# Patient Record
Sex: Male | Born: 1955 | Race: Black or African American | Hispanic: No | Marital: Single | Smoking: Never smoker
Health system: Southern US, Community
[De-identification: ages and names within clinical notes are randomized; demographics above are authoritative.]

## PROBLEM LIST (undated history)

## (undated) DIAGNOSIS — S37009A Unspecified injury of unspecified kidney, initial encounter: Secondary | ICD-10-CM

## (undated) DIAGNOSIS — I10 Essential (primary) hypertension: Secondary | ICD-10-CM

## (undated) HISTORY — DX: Essential (primary) hypertension: I10

## (undated) HISTORY — DX: Unspecified injury of unspecified kidney, initial encounter: S37.009A

## (undated) HISTORY — PX: HAND SURGERY: SHX662

---

## 1981-07-06 DIAGNOSIS — I10 Essential (primary) hypertension: Secondary | ICD-10-CM

## 1981-07-06 HISTORY — DX: Essential (primary) hypertension: I10

## 2003-05-08 ENCOUNTER — Other Ambulatory Visit: Payer: Self-pay

## 2003-07-10 ENCOUNTER — Other Ambulatory Visit: Payer: Self-pay

## 2004-08-18 ENCOUNTER — Emergency Department: Payer: Self-pay | Admitting: Emergency Medicine

## 2010-10-17 ENCOUNTER — Ambulatory Visit: Payer: Self-pay | Admitting: Gastroenterology

## 2010-10-18 LAB — HM COLONOSCOPY

## 2010-10-21 LAB — PATHOLOGY REPORT

## 2012-08-09 ENCOUNTER — Ambulatory Visit: Payer: Self-pay | Admitting: Internal Medicine

## 2012-08-15 ENCOUNTER — Ambulatory Visit: Payer: Self-pay | Admitting: Internal Medicine

## 2012-09-07 ENCOUNTER — Ambulatory Visit: Payer: Self-pay | Admitting: Internal Medicine

## 2012-09-16 ENCOUNTER — Ambulatory Visit (INDEPENDENT_AMBULATORY_CARE_PROVIDER_SITE_OTHER): Payer: BC Managed Care – PPO | Admitting: Internal Medicine

## 2012-09-16 ENCOUNTER — Encounter: Payer: Self-pay | Admitting: Internal Medicine

## 2012-09-16 VITALS — BP 108/78 | HR 74 | Temp 98.1°F | Ht 65.5 in | Wt 148.0 lb

## 2012-09-16 DIAGNOSIS — R0609 Other forms of dyspnea: Secondary | ICD-10-CM | POA: Insufficient documentation

## 2012-09-16 DIAGNOSIS — I10 Essential (primary) hypertension: Secondary | ICD-10-CM | POA: Insufficient documentation

## 2012-09-16 NOTE — Assessment & Plan Note (Signed)
Blood pressure well-controlled on current medications. Reviewed recent renal function drawn by his employer. Creatinine 1.3. Will request previous records to see if this is stable. Followup one month.

## 2012-09-16 NOTE — Progress Notes (Signed)
Subjective:    Patient ID: George Howell, male    DOB: 02-29-1956, 57 y.o.   MRN: [id number]  HPI 57 year old male with history of hypertension presents to establish care. He reports he is generally been feeling well. He notes a single episode of shortness of breath with exertion when he was walking with his son. He denies chest pain. He denies diaphoresis or nausea. He reports that he was recently walking in the park with his son and experienced no shortness of breath. He does not smoke. He does not have a history of asthma. He reports that he is compliant with his medications. He notes that he had a stress test in the past which was normal.  Outpatient Encounter Prescriptions as of 09/16/2012  Medication Sig Dispense Refill  . atenolol (TENORMIN) 50 MG tablet Take 50 mg by mouth daily.      . Cholecalciferol (VITAMIN D3) 2000 UNITS TABS Take 2,000 Units by mouth daily.      . enalapril (VASOTEC) 5 MG tablet Take 5 mg by mouth daily.      . hydrochlorothiazide (MICROZIDE) 12.5 MG capsule Take 12.5 mg by mouth daily.       No facility-administered encounter medications on file as of 09/16/2012.   BP 108/78  Pulse 74  Temp(Src) 98.1 F (36.7 C) (Oral)  Ht 5' 5.5" (1.664 m)  Wt 148 lb (67.132 kg)  BMI 24.25 kg/m2  SpO2 95%  Review of Systems  Constitutional: Negative for fever, chills, activity change, appetite change, fatigue and unexpected weight change.  Eyes: Negative for visual disturbance.  Respiratory: Positive for shortness of breath. Negative for cough.   Cardiovascular: Negative for chest pain, palpitations and leg swelling.  Gastrointestinal: Negative for abdominal pain and abdominal distention.  Genitourinary: Negative for dysuria, urgency and difficulty urinating.  Musculoskeletal: Negative for arthralgias and gait problem.  Skin: Negative for color change and rash.  Hematological: Negative for adenopathy.  Psychiatric/Behavioral: Negative for sleep disturbance and dysphoric  mood. The patient is not nervous/anxious.        Objective:   Physical Exam  Constitutional: He is oriented to person, place, and time. He appears well-developed and well-nourished. No distress.  HENT:  Head: Normocephalic and atraumatic.  Right Ear: External ear normal.  Left Ear: External ear normal.  Nose: Nose normal.  Mouth/Throat: Oropharynx is clear and moist. No oropharyngeal exudate.  Eyes: Conjunctivae and EOM are normal. Pupils are equal, round, and reactive to light. Right eye exhibits no discharge. Left eye exhibits no discharge. No scleral icterus.  Neck: Normal range of motion. Neck supple. No tracheal deviation present. No thyromegaly present.  Cardiovascular: Normal rate, regular rhythm and normal heart sounds.  Exam reveals no gallop and no friction rub.   No murmur heard. Pulmonary/Chest: Effort normal and breath sounds normal. No respiratory distress. He has no wheezes. He has no rales. He exhibits no tenderness.  Abdominal: Soft. Bowel sounds are normal. He exhibits no distension and no mass. There is no tenderness. There is no rebound and no guarding.  Musculoskeletal: Normal range of motion. He exhibits no edema.  Lymphadenopathy:    He has no cervical adenopathy.  Neurological: He is alert and oriented to person, place, and time. No cranial nerve deficit. Coordination normal.  Skin: Skin is warm and dry. No rash noted. He is not diaphoretic. No erythema. No pallor.  Psychiatric: He has a normal mood and affect. His behavior is normal. Judgment and thought content normal.  Assessment & Plan:

## 2012-09-16 NOTE — Assessment & Plan Note (Signed)
Patient had a single episode of dyspnea on exertion. Denies chest pain. Exam is normal today. EKG today normal. Reports history of stress test in the past. We'll request records on this. Followup 4 weeks.

## 2012-10-11 ENCOUNTER — Encounter: Payer: Self-pay | Admitting: Internal Medicine

## 2012-11-10 ENCOUNTER — Ambulatory Visit: Payer: PRIVATE HEALTH INSURANCE | Admitting: Internal Medicine

## 2012-11-24 ENCOUNTER — Encounter: Payer: Self-pay | Admitting: Internal Medicine

## 2012-11-24 ENCOUNTER — Ambulatory Visit (INDEPENDENT_AMBULATORY_CARE_PROVIDER_SITE_OTHER): Payer: BC Managed Care – PPO | Admitting: Internal Medicine

## 2012-11-24 VITALS — BP 110/78 | HR 67 | Temp 98.1°F | Wt 144.0 lb

## 2012-11-24 DIAGNOSIS — R0989 Other specified symptoms and signs involving the circulatory and respiratory systems: Secondary | ICD-10-CM

## 2012-11-24 DIAGNOSIS — R0789 Other chest pain: Secondary | ICD-10-CM | POA: Insufficient documentation

## 2012-11-24 DIAGNOSIS — I10 Essential (primary) hypertension: Secondary | ICD-10-CM

## 2012-11-24 LAB — COMPREHENSIVE METABOLIC PANEL
ALT: 15 U/L (ref 0–53)
Albumin: 3.6 g/dL (ref 3.5–5.2)
CO2: 26 mEq/L (ref 19–32)
Calcium: 8.9 mg/dL (ref 8.4–10.5)
Chloride: 106 mEq/L (ref 96–112)
GFR: 68.22 mL/min (ref >60.00)
Potassium: 4.3 mEq/L (ref 3.5–5.1)
Sodium: 137 mEq/L (ref 135–145)
Total Protein: 6.3 g/dL (ref 6.0–8.3)

## 2012-11-24 NOTE — Assessment & Plan Note (Signed)
BP Readings from Last 3 Encounters:  11/24/12 110/78  09/16/12 108/78   BP well controlled on current medication. Will recheck renal function with labs today.

## 2012-11-24 NOTE — Progress Notes (Signed)
  Subjective:    Patient ID: George Howell, male    DOB: Sep 21, 1955, 57 y.o.   MRN: [id number]  HPI 57 year old male with history of hypertension presents for followup. He reports he is generally feeling well. He is compliant with medications. At his last visit, he had complained of a single episode of shortness of breath. He denies any repeated episodes of shortness of breath. However, he has had some intermittent episodes of left-sided chest tightness with exertion. These last for a few seconds and then resolve without any intervention. Aside from this, he is feeling well.  Outpatient Encounter Prescriptions as of 11/24/2012  Medication Sig Dispense Refill  . atenolol (TENORMIN) 50 MG tablet Take 50 mg by mouth daily.      . Cholecalciferol (VITAMIN D3) 2000 UNITS TABS Take 2,000 Units by mouth daily.      . enalapril (VASOTEC) 5 MG tablet Take 5 mg by mouth daily.      . hydrochlorothiazide (MICROZIDE) 12.5 MG capsule Take 12.5 mg by mouth daily.       No facility-administered encounter medications on file as of 11/24/2012.   BP 110/78  Pulse 67  Temp(Src) 98.1 F (36.7 C) (Oral)  Wt 144 lb (65.318 kg)  BMI 23.59 kg/m2  SpO2 97%  Review of Systems  Constitutional: Negative for fever, chills, activity change, appetite change, fatigue and unexpected weight change.  Eyes: Negative for visual disturbance.  Respiratory: Positive for chest tightness. Negative for cough and shortness of breath.   Cardiovascular: Negative for chest pain, palpitations and leg swelling.  Gastrointestinal: Negative for abdominal pain and abdominal distention.  Genitourinary: Negative for dysuria, urgency and difficulty urinating.  Musculoskeletal: Negative for arthralgias and gait problem.  Skin: Negative for color change and rash.  Hematological: Negative for adenopathy.  Psychiatric/Behavioral: Negative for sleep disturbance and dysphoric mood. The patient is not nervous/anxious.        Objective:   Physical  Exam  Constitutional: He is oriented to person, place, and time. He appears well-developed and well-nourished. No distress.  HENT:  Head: Normocephalic and atraumatic.  Right Ear: External ear normal.  Left Ear: External ear normal.  Nose: Nose normal.  Mouth/Throat: Oropharynx is clear and moist. No oropharyngeal exudate.  Eyes: Conjunctivae and EOM are normal. Pupils are equal, round, and reactive to light. Right eye exhibits no discharge. Left eye exhibits no discharge. No scleral icterus.  Neck: Normal range of motion. Neck supple. No tracheal deviation present. No thyromegaly present.  Cardiovascular: Normal rate, regular rhythm and normal heart sounds.  Exam reveals no gallop and no friction rub.   No murmur heard. Pulmonary/Chest: Effort normal and breath sounds normal. No respiratory distress. He has no wheezes. He has no rales. He exhibits no tenderness.  Musculoskeletal: Normal range of motion. He exhibits no edema.  Lymphadenopathy:    He has no cervical adenopathy.  Neurological: He is alert and oriented to person, place, and time. No cranial nerve deficit. Coordination normal.  Skin: Skin is warm and dry. No rash noted. He is not diaphoretic. No erythema. No pallor.  Psychiatric: He has a normal mood and affect. His behavior is normal. Judgment and thought content normal.          Assessment & Plan:

## 2012-11-24 NOTE — Assessment & Plan Note (Signed)
Intermittent episodes of left sided chest tightness. Risk factors for CAD including HTN and family history. EKG last visit was normal. Discussed cardiology evaluation with stress test. Will set this up.

## 2012-11-24 NOTE — Assessment & Plan Note (Signed)
No further episodes of dyspnea with exertion since last visit. Will continue to monitor.

## 2012-11-25 ENCOUNTER — Encounter: Payer: Self-pay | Admitting: *Deleted

## 2012-11-30 ENCOUNTER — Ambulatory Visit: Payer: PRIVATE HEALTH INSURANCE | Admitting: Cardiovascular Disease

## 2012-12-08 ENCOUNTER — Encounter: Payer: Self-pay | Admitting: Cardiovascular Disease

## 2012-12-08 ENCOUNTER — Ambulatory Visit (INDEPENDENT_AMBULATORY_CARE_PROVIDER_SITE_OTHER): Payer: PRIVATE HEALTH INSURANCE | Admitting: Cardiovascular Disease

## 2012-12-08 VITALS — BP 120/82 | HR 63 | Ht 67.0 in | Wt 143.2 lb

## 2012-12-08 DIAGNOSIS — I10 Essential (primary) hypertension: Secondary | ICD-10-CM

## 2012-12-08 DIAGNOSIS — R0789 Other chest pain: Secondary | ICD-10-CM

## 2012-12-08 DIAGNOSIS — R0609 Other forms of dyspnea: Secondary | ICD-10-CM

## 2012-12-08 DIAGNOSIS — R0602 Shortness of breath: Secondary | ICD-10-CM

## 2012-12-08 NOTE — Assessment & Plan Note (Signed)
Shortness of breath associated with his chest discomfort. Possibly from angina. Stress test ordered

## 2012-12-08 NOTE — Patient Instructions (Addendum)
You are doing well. No medication changes were made.  We will schedule you for an echo stress test Please hold your atenolol the morning of the test  Please call us if you have new issues that need to be addressed before your next appt.    ]Your physician has requested that you have a stress echocardiogram. For further information please visit [email or web address] Please follow instruction sheet as given.

## 2012-12-08 NOTE — Assessment & Plan Note (Signed)
Chest pain concerning for angina. Nonspecific EKG abnormality also noted. He reports having family history of coronary artery disease. His cholesterol is average. He is a nonsmoker. He is interested in further evaluation as his chest pain symptoms have become bothersome. We'll schedule him for a stress echo in our office at his convenience. No further medication changes made.

## 2012-12-08 NOTE — Assessment & Plan Note (Signed)
Blood pressure is well controlled on today's visit. No changes made to the medications. 

## 2012-12-08 NOTE — Progress Notes (Signed)
   Patient ID: George Howell, male    DOB: 10/14/1955, 57 y.o.   MRN: [id number]  HPI Comments: George Howell is a very pleasant 57 year old gentleman who works in [employer] he states having left-sided chest tightness presents for further evaluation.   He states that symptoms started approximately one year ago. It seems to happen more when he is at work. When he is very active, he has chest tightness on the left described as a squeezing, also with shortness of breath. Commonly he has to sit down and recover. Symptoms are about the same and are not escalating. Occasionally has symptoms at rest, more commonly at work. He does do some lifting of items above his head but denies any reproducible pain with these movements. Unable to reproduce pain with palpation of the left. He does not have any lightheadedness or dizziness.  Nonsmoker, no diabetes. Unclear of his family history but reports his father had CAD and died shortly after possible catheterization. Details are unclear.  EKG shows normal sinus rhythm with rate 63 beats per minute, nonspecific ST abnormality in leads V5, V6, 3, aVF    Outpatient Encounter Prescriptions as of 12/08/2012  Medication Sig Dispense Refill  . aspirin 81 MG tablet Take 81 mg by mouth daily.      Marland Kitchen atenolol (TENORMIN) 50 MG tablet Take 50 mg by mouth daily.      . Cholecalciferol (VITAMIN D3) 2000 UNITS TABS Take 2,000 Units by mouth daily.      . enalapril (VASOTEC) 5 MG tablet Take 5 mg by mouth daily.      . hydrochlorothiazide (MICROZIDE) 12.5 MG capsule Take 12.5 mg by mouth daily.        Review of Systems  Constitutional: Negative.   HENT: Negative.   Eyes: Negative.   Respiratory: Positive for chest tightness and shortness of breath.   Cardiovascular: Positive for chest pain.  Gastrointestinal: Negative.   Musculoskeletal: Negative.   Skin: Negative.   Neurological: Negative.   Psychiatric/Behavioral: Negative.   All other systems reviewed and are  negative.    BP 120/82  Pulse 63  Ht 5\' 7"  (1.702 m)  Wt 143 lb 4 oz (64.978 kg)  BMI 22.43 kg/m2  Physical Exam  Nursing note and vitals reviewed. Constitutional: He is oriented to person, place, and time. He appears well-developed and well-nourished.  HENT:  Head: Normocephalic.  Nose: Nose normal.  Mouth/Throat: Oropharynx is clear and moist.  Eyes: Conjunctivae are normal. Pupils are equal, round, and reactive to light.  Neck: Normal range of motion. Neck supple. No JVD present.  Cardiovascular: Normal rate, regular rhythm, S1 normal, S2 normal, normal heart sounds and intact distal pulses.  Exam reveals no gallop and no friction rub.   No murmur heard. Pulmonary/Chest: Effort normal and breath sounds normal. No respiratory distress. He has no wheezes. He has no rales. He exhibits no tenderness.  Abdominal: Soft. Bowel sounds are normal. He exhibits no distension. There is no tenderness.  Musculoskeletal: Normal range of motion. He exhibits no edema and no tenderness.  Lymphadenopathy:    He has no cervical adenopathy.  Neurological: He is alert and oriented to person, place, and time. Coordination normal.  Skin: Skin is warm and dry. No rash noted. No erythema.  Psychiatric: He has a normal mood and affect. His behavior is normal. Judgment and thought content normal.      Assessment and Plan

## 2013-01-19 ENCOUNTER — Other Ambulatory Visit: Payer: PRIVATE HEALTH INSURANCE

## 2013-02-02 ENCOUNTER — Other Ambulatory Visit (INDEPENDENT_AMBULATORY_CARE_PROVIDER_SITE_OTHER): Payer: BC Managed Care – PPO

## 2013-02-02 DIAGNOSIS — R0602 Shortness of breath: Secondary | ICD-10-CM

## 2013-02-02 DIAGNOSIS — R079 Chest pain, unspecified: Secondary | ICD-10-CM

## 2013-02-02 DIAGNOSIS — R0789 Other chest pain: Secondary | ICD-10-CM

## 2013-02-13 ENCOUNTER — Telehealth: Payer: Self-pay

## 2013-02-14 NOTE — Telephone Encounter (Signed)
error 

## 2013-02-15 ENCOUNTER — Telehealth: Payer: Self-pay

## 2013-02-15 NOTE — Telephone Encounter (Signed)
Message copied by Marilynne Halsted on Wed Feb 15, 2013  2:38 PM ------      Message from: Antonieta Iba      Created: Sat Feb 11, 2013 10:39 PM       Negative stress test      Some ekg changes seen at rest and stress      Echo did not show wall motion changes concerning for blockage      If symptoms stable, would just monitor for now      If symptoms get worse, would call office       ------

## 2013-02-16 ENCOUNTER — Telehealth: Payer: Self-pay

## 2013-02-16 NOTE — Telephone Encounter (Signed)
Spoke w/ pt.  Aware of results of stress test and verbalizes understanding that he will call if he has any symptoms.

## 2018-07-11 ENCOUNTER — Emergency Department: Payer: BLUE CROSS/BLUE SHIELD

## 2018-07-11 ENCOUNTER — Emergency Department
Admission: EM | Admit: 2018-07-11 | Discharge: 2018-07-11 | Disposition: A | Discharge status: Home / Self Care | Payer: BLUE CROSS/BLUE SHIELD | Attending: Emergency Medicine | Admitting: Emergency Medicine

## 2018-07-11 ENCOUNTER — Encounter: Payer: Self-pay | Admitting: Emergency Medicine

## 2018-07-11 DIAGNOSIS — E860 Dehydration: Secondary | ICD-10-CM | POA: Insufficient documentation

## 2018-07-11 DIAGNOSIS — Z79899 Other long term (current) drug therapy: Secondary | ICD-10-CM | POA: Insufficient documentation

## 2018-07-11 DIAGNOSIS — N179 Acute kidney failure, unspecified: Secondary | ICD-10-CM | POA: Insufficient documentation

## 2018-07-11 DIAGNOSIS — N19 Unspecified kidney failure: Secondary | ICD-10-CM

## 2018-07-11 DIAGNOSIS — R55 Syncope and collapse: Secondary | ICD-10-CM | POA: Diagnosis not present

## 2018-07-11 DIAGNOSIS — I10 Essential (primary) hypertension: Secondary | ICD-10-CM | POA: Insufficient documentation

## 2018-07-11 DIAGNOSIS — Z7982 Long term (current) use of aspirin: Secondary | ICD-10-CM | POA: Insufficient documentation

## 2018-07-11 LAB — COMPREHENSIVE METABOLIC PANEL
ALT: 16 U/L (ref 0–44)
AST: 31 U/L (ref 15–41)
Albumin: 3.4 g/dL — ABNORMAL LOW (ref 3.5–5.0)
Alkaline Phosphatase: 42 U/L (ref 38–126)
Anion gap: 8 (ref 5–15)
BUN: 31 mg/dL — ABNORMAL HIGH (ref 8–23)
CO2: 23 mmol/L (ref 22–32)
Calcium: 7.4 mg/dL — ABNORMAL LOW (ref 8.9–10.3)
Chloride: 104 mmol/L (ref 98–111)
Creatinine, Ser: 2.05 mg/dL — ABNORMAL HIGH (ref 0.61–1.24)
GFR calc Af Amer: 39 mL/min — ABNORMAL LOW (ref >60)
GFR, EST NON AFRICAN AMERICAN: 34 mL/min — AB (ref >60)
Glucose, Bld: 85 mg/dL (ref 70–99)
Potassium: 4.3 mmol/L (ref 3.5–5.1)
Sodium: 135 mmol/L (ref 135–145)
Total Bilirubin: 0.9 mg/dL (ref 0.3–1.2)
Total Protein: 6.6 g/dL (ref 6.5–8.1)

## 2018-07-11 LAB — URINALYSIS, COMPLETE (UACMP) WITH MICROSCOPIC
Bilirubin Urine: NEGATIVE
Glucose, UA: NEGATIVE mg/dL
Ketones, ur: NEGATIVE mg/dL
Leukocytes, UA: NEGATIVE
Nitrite: NEGATIVE
PROTEIN: NEGATIVE mg/dL
SQUAMOUS EPITHELIAL / LPF: NONE SEEN (ref 0–5)
Specific Gravity, Urine: 1.01 (ref 1.005–1.030)
WBC, UA: NONE SEEN WBC/hpf (ref 0–5)
pH: 5 (ref 5.0–8.0)

## 2018-07-11 LAB — BASIC METABOLIC PANEL
ANION GAP: 10 (ref 5–15)
Anion gap: 13 (ref 5–15)
BUN: 34 mg/dL — ABNORMAL HIGH (ref 8–23)
BUN: 33 mg/dL — ABNORMAL HIGH (ref 8–23)
CALCIUM: 8.5 mg/dL — AB (ref 8.9–10.3)
CALCIUM: 8.7 mg/dL — AB (ref 8.9–10.3)
CO2: 24 mmol/L (ref 22–32)
CO2: 23 mmol/L (ref 22–32)
CREATININE: 2.46 mg/dL — AB (ref 0.61–1.24)
Chloride: 98 mmol/L (ref 98–111)
Chloride: 95 mmol/L — ABNORMAL LOW (ref 98–111)
Creatinine, Ser: 2.39 mg/dL — ABNORMAL HIGH (ref 0.61–1.24)
GFR calc Af Amer: 31 mL/min — ABNORMAL LOW (ref >60)
GFR calc Af Amer: 32 mL/min — ABNORMAL LOW (ref >60)
GFR calc non Af Amer: 28 mL/min — ABNORMAL LOW (ref >60)
GFR, EST NON AFRICAN AMERICAN: 27 mL/min — AB (ref >60)
GLUCOSE: 104 mg/dL — AB (ref 70–99)
Glucose, Bld: 109 mg/dL — ABNORMAL HIGH (ref 70–99)
Potassium: 3.6 mmol/L (ref 3.5–5.1)
Potassium: 3.8 mmol/L (ref 3.5–5.1)
SODIUM: 132 mmol/L — AB (ref 135–145)
Sodium: 131 mmol/L — ABNORMAL LOW (ref 135–145)

## 2018-07-11 LAB — TROPONIN I
Troponin I: 0.06 ng/mL (ref <0.03)
Troponin I: 0.05 ng/mL (ref <0.03)

## 2018-07-11 LAB — CBC
HCT: 43.2 % (ref 39.0–52.0)
Hemoglobin: 14.2 g/dL (ref 13.0–17.0)
MCH: 30.9 pg (ref 26.0–34.0)
MCHC: 32.9 g/dL (ref 30.0–36.0)
MCV: 94.1 fL (ref 80.0–100.0)
Platelets: 231 10*3/uL (ref 150–400)
RBC: 4.59 MIL/uL (ref 4.22–5.81)
RDW: 13.2 % (ref 11.5–15.5)
WBC: 6.3 10*3/uL (ref 4.0–10.5)
nRBC: 0 % (ref 0.0–0.2)

## 2018-07-11 LAB — GLUCOSE, CAPILLARY: GLUCOSE-CAPILLARY: 84 mg/dL (ref 70–99)

## 2018-07-11 MED ORDER — AMLODIPINE BESYLATE 2.5 MG PO TABS: 2.5000 mg | ORAL_TABLET | Freq: Every day | ORAL | 0 refills | Status: AC

## 2018-07-11 MED ORDER — SODIUM CHLORIDE 0.9 % IV BOLUS
1000.0000 mL | Freq: Once | INTRAVENOUS | Status: AC
  Administered 2018-07-11: 1000 mL via INTRAVENOUS

## 2018-07-11 NOTE — ED Notes (Signed)
2nd liter completed. Repeat labs drawn and sent. Vss. Patient denies any discomfrots. Awaiting results and disposition.

## 2018-07-11 NOTE — ED Provider Notes (Signed)
[facility] Emergency Department Provider Note  ____________________________________________   First MD Initiated Contact with Patient 07/11/18 1224     (approximate)  I have reviewed the triage vital signs and the nursing notes.   HISTORY  Chief Complaint Loss of Consciousness and Dizziness   HPI George Howell is a 63 y.o. male with a history of hypertension "kidney failure" was presented emergency after a near syncopal episode.  He states that he was going to the bathroom this morning before work when he felt very lightheaded.  He says that he became weak to the point where he fell, hitting the back of his head as well as the right side of his thoracic back.  He is not complaining of a 5 out of 10 headache.  Says that he was able to get up and then go to work where he bent over and then felt very shaky.  He denies any chest pain or feeling of rapid heart rate.  He says that he is also had upper respiratory symptoms lately but they have improved after taking Benadryl.  He states that he has also had a decreased appetite over the past several days with his illness.  Denies any diarrhea, nausea or vomiting.  [facility] but is now in the emergency department after being sent over for concern of hitting his head.  Patient denies taking blood thinners.   Past Medical History:  Diagnosis Date  . Hypertension 1983  . Kidney damage     Patient Active Problem List   Diagnosis Date Noted  . Chest tightness 11/24/2012  . Essential hypertension, benign 09/16/2012  . Dyspnea on exertion 09/16/2012    Past Surgical History:  Procedure Laterality Date  . HAND SURGERY     bilateral wrist, ?neuroma    Prior to Admission medications   Medication Sig Start Date End Date Taking? Authorizing Provider  aspirin 81 MG tablet Take 81 mg by mouth daily.    [provider]  atenolol (TENORMIN) 50 MG tablet Take 50 mg by mouth daily.     [provider]  Cholecalciferol (VITAMIN D3) 2000 UNITS TABS Take 2,000 Units by mouth daily.    [provider]  enalapril (VASOTEC) 5 MG tablet Take 5 mg by mouth daily.    [provider]  hydrochlorothiazide (MICROZIDE) 12.5 MG capsule Take 12.5 mg by mouth daily.    [provider]    Allergies Patient has no known allergies.  Family History  Problem Relation Age of Onset  . Heart disease Father   . Heart disease Sister   . Diabetes Brother   . Cancer Paternal Aunt        breast  . Hypertension Brother     Social History Social History   Tobacco Use  . Smoking status: Never Smoker  . Smokeless tobacco: Never Used  Substance Use Topics  . Alcohol use: No  . Drug use: No    Types: Marijuana    Comment: past    Review of Systems  Constitutional: No fever/chills Eyes: No visual changes. ENT: No sore throat. Cardiovascular: Denies chest pain. Respiratory: Denies shortness of breath. Gastrointestinal: No abdominal pain.  No nausea, no vomiting.  No diarrhea.  No constipation. Genitourinary: Negative for dysuria. Musculoskeletal: Negative for back pain. Skin: Negative for rash. Neurological: Negative for focal weakness or numbness.   ____________________________________________   PHYSICAL EXAM:  VITAL SIGNS: ED Triage Vitals  Enc Vitals Group  BP 07/11/18 1148 116/81     Pulse Rate 07/11/18 1148 70     Resp 07/11/18 1148 16     Temp 07/11/18 1148 97.8 F (36.6 C)     Temp Source 07/11/18 1148 Oral     SpO2 07/11/18 1148 99 %     Weight 07/11/18 1132 130 lb (59 kg)     Height 07/11/18 1132 5\' 7"  (1.702 m)     Head Circumference --      Peak Flow --      Pain Score 07/11/18 1132 0     Pain Loc --      Pain Edu? --      Excl. in GC? --     Constitutional: Alert and oriented. Well appearing and in no acute distress. Eyes: Conjunctivae are normal.  Head: Minimal tenderness to the right occiput without any  depression, bogginess or hematoma palpated. Nose: No congestion/rhinnorhea. Mouth/Throat: Mucous membranes are moist.  Neck: No stridor.  Patient ranges head neck freely. Cardiovascular: Normal rate, regular rhythm. Grossly normal heart sounds.   Respiratory: Normal respiratory effort.  No retractions. Lungs CTAB. Gastrointestinal: Soft and nontender. No distention. No CVA tenderness. Musculoskeletal: No lower extremity tenderness nor edema.  No joint effusions. Neurologic:  Normal speech and language. No gross focal neurologic deficits are appreciated. Skin:  Skin is warm, dry and intact. No rash noted. Psychiatric: Mood and affect are normal. Speech and behavior are normal.  ____________________________________________   LABS (all labs ordered are listed, but only abnormal results are displayed)  Labs Reviewed  BASIC METABOLIC PANEL - Abnormal; Notable for the following components:      Result Value   Sodium 132 (*)    Glucose, Bld 109 (*)    BUN 34 (*)    Creatinine, Ser 2.46 (*)    Calcium 8.5 (*)    GFR calc non Af Amer 27 (*)    GFR calc Af Amer 31 (*)    All other components within normal limits  URINALYSIS, COMPLETE (UACMP) WITH MICROSCOPIC - Abnormal; Notable for the following components:   Color, Urine YELLOW (*)    APPearance CLEAR (*)    Hgb urine dipstick SMALL (*)    Bacteria, UA RARE (*)    All other components within normal limits  TROPONIN I - Abnormal; Notable for the following components:   Troponin I 0.06 (*)    All other components within normal limits  BASIC METABOLIC PANEL - Abnormal; Notable for the following components:   Sodium 131 (*)    Chloride 95 (*)    Glucose, Bld 104 (*)    BUN 33 (*)    Creatinine, Ser 2.39 (*)    Calcium 8.7 (*)    GFR calc non Af Amer 28 (*)    GFR calc Af Amer 32 (*)    All other components within normal limits  COMPREHENSIVE METABOLIC PANEL - Abnormal; Notable for the following components:   BUN 31 (*)     Creatinine, Ser 2.05 (*)    Calcium 7.4 (*)    Albumin 3.4 (*)    GFR calc non Af Amer 34 (*)    GFR calc Af Amer 39 (*)    All other components within normal limits  TROPONIN I - Abnormal; Notable for the following components:   Troponin I 0.05 (*)    All other components within normal limits  CBC  GLUCOSE, CAPILLARY  CBG MONITORING, ED   ____________________________________________  EKG  ED  ECG REPORT I, Arelia Longest, the attending physician, personally viewed and interpreted this ECG.   Date: 07/11/2018  EKG Time: 1133  Rate: 68  Rhythm: normal sinus rhythm  Axis: Normal  Intervals:none  ST&T Change: No ST segment elevation or depression.  No abnormal T wave inversion.  ____________________________________________  RADIOLOGY  Ultrasound of the kidneys with bilateral echogenic kidneys consistent with renal medical disease.  Rib series with no evidence of rib fracture.  CT head without acute pathology. ____________________________________________   PROCEDURES  Procedure(s) performed:   Procedures  Critical Care performed:   ____________________________________________   INITIAL IMPRESSION / ASSESSMENT AND PLAN / ED COURSE  Pertinent labs & imaging results that were available during my care of the patient were reviewed by me and considered in my medical decision making (see chart for details).  DDX: Kidney failure, dehydration, skull fracture, intracranial hematoma, renal artery stenosis, renal failure, arrhythmia, MI As part of my medical decision making, I reviewed the following data within the electronic MEDICAL RECORD NUMBER Notes from prior patient visits  ----------------------------------------- 1:38 PM on 07/11/2018 -----------------------------------------  Discussed case with Dr. Thedore Mins who recommends a renal ultrasound.  Also recommended the patient discontinue his enalapril as well as HCTZ and switch to 2.5 mg daily of Norvasc.  Will give the  patient fluids as well as recheck a BMP.  Also plan on trending the troponin which slightly elevated at 0.07.  However, this may be a result of the patient having an elevated creatinine.  ----------------------------------------- 8:08 PM on 07/11/2018 -----------------------------------------  Patient at this time with improvement in his renal function as well as his troponin.  He was able to ambulate without feeling lightheaded.  He will be discharged at this time and will follow-up at his work clinic where he says that they draw blood and he is prescribed his blood pressure medications.  I will also give him the phone number for nephrology follow-up.  As discussed with Dr. Thedore Mins earlier, will discontinue his HCTZ as well as enalapril and start him on Norvasc.  The patient is understanding of this change in his medication as well as diagnosis and treatment and willing to comply.  Pt likely with long standing renal disease as demonstrated on the renal u/s.  Troponin likely elevated secondary to renal failure.  Reassuring EKG. ____________________________________________   FINAL CLINICAL IMPRESSION(S) / ED DIAGNOSES  Near syncope.  Dehydration.  Acute renal failure.   NEW MEDICATIONS STARTED DURING THIS VISIT:  New Prescriptions   No medications on file     Note:  This document was prepared using Dragon voice recognition software and may include unintentional dictation errors.     Myrna Blazer, MD 07/11/18 2009

## 2018-07-11 NOTE — ED Notes (Signed)
Lab called order placed will rum green top sent foe CMP and troponin. Results pending.

## 2018-07-11 NOTE — ED Notes (Signed)
Labs drawn and sent. Patient off unit to renal xray.

## 2018-07-11 NOTE — ED Triage Notes (Signed)
Pt reports this am he got up for work felt weak and different and then passed out falling to the ground. Pt reports he went to work and while there he was shaky and felt like he was going to pass out again and they sent him here. Pt denies CP, SOB or other symptoms, states he feels weak.

## 2018-07-11 NOTE — ED Triage Notes (Signed)
First RN Note: Pt presents from [facility] via wheelchair. Per [facility] staff patient had a syncopal episode today and fell back and hit his head. Pt is alert and appropriate upon arrival to ED.

## 2018-07-11 NOTE — ED Notes (Signed)
seond liter infusing.

## 2019-05-22 ENCOUNTER — Other Ambulatory Visit (INDEPENDENT_AMBULATORY_CARE_PROVIDER_SITE_OTHER): Payer: Self-pay | Admitting: Nephrology

## 2019-05-22 ENCOUNTER — Other Ambulatory Visit: Payer: Self-pay

## 2019-05-22 ENCOUNTER — Other Ambulatory Visit (INDEPENDENT_AMBULATORY_CARE_PROVIDER_SITE_OTHER): Payer: BC Managed Care – PPO

## 2019-05-22 DIAGNOSIS — N1832 Chronic kidney disease, stage 3b: Secondary | ICD-10-CM | POA: Diagnosis not present

## 2020-11-27 IMAGING — CT CT HEAD W/O CM
3 series · 16 of 46 positions shown, 19 images · non-contrast
Comparison: None.

CLINICAL DATA: Head trauma, fell. Hit back of head.

EXAM:
CT HEAD WITHOUT CONTRAST
TECHNIQUE: Contiguous axial images were obtained from the base of the skull
through the vertex without intravenous contrast.

[Series 4: head wo · axial · 0.40mm/px · z∈[-99,+21]mm · 10 of 29 slices shown, 13 images]
[im 3/29  brain]
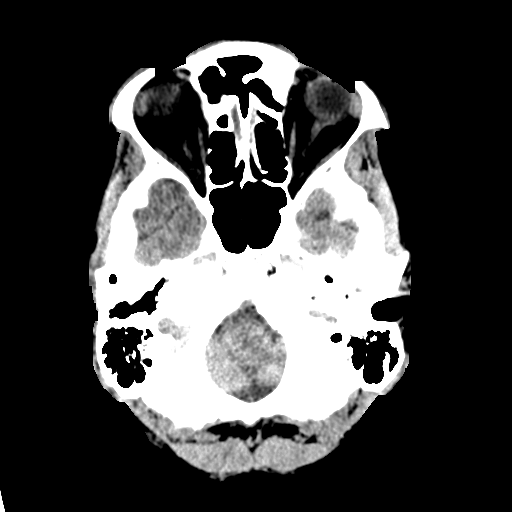
[im 3/29  bone]
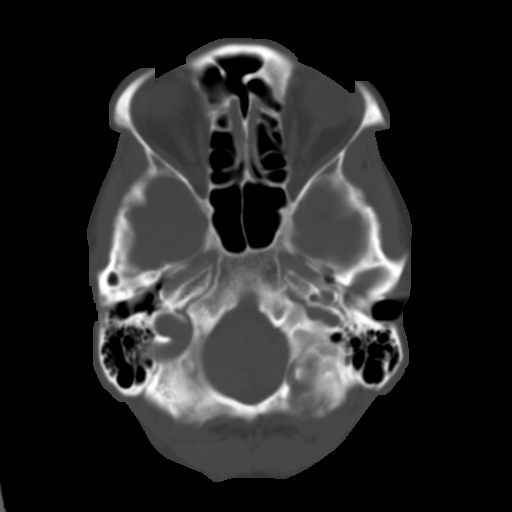
[im 6/29  brain]
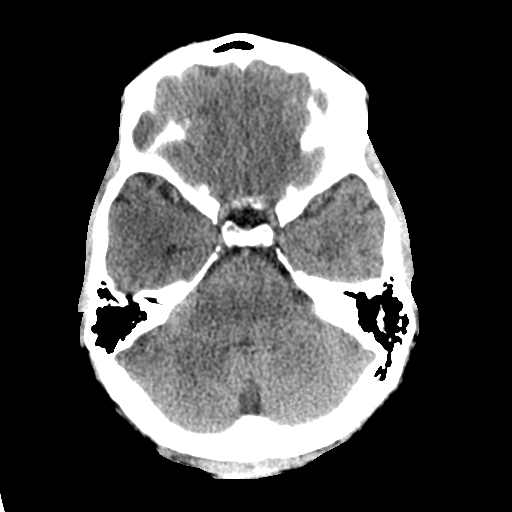
[im 8/29  brain]
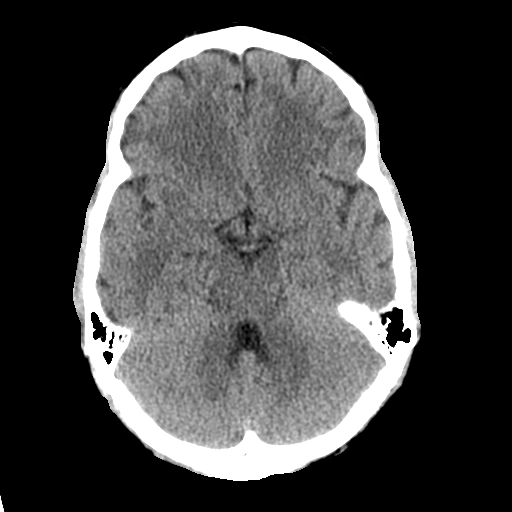
[im 11/29  brain]
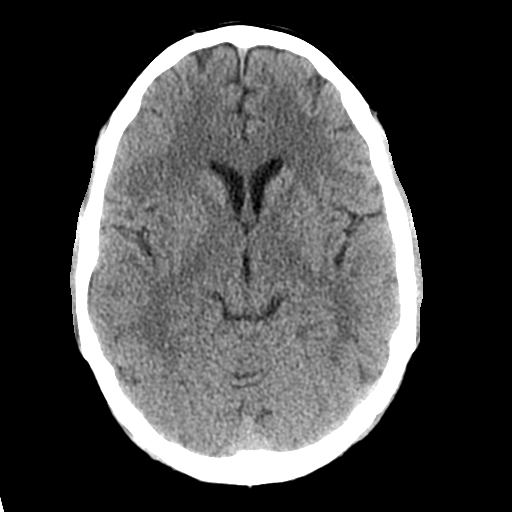
[im 14/29  brain]
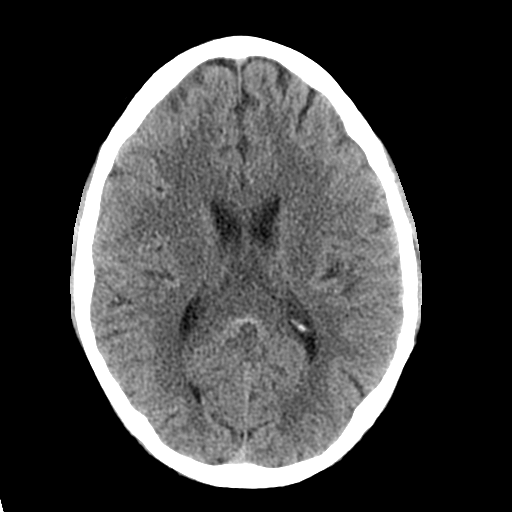
[im 14/29  bone]
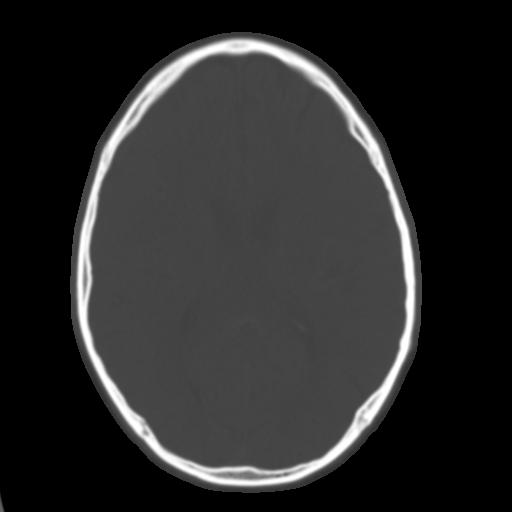
[im 16/29  brain]
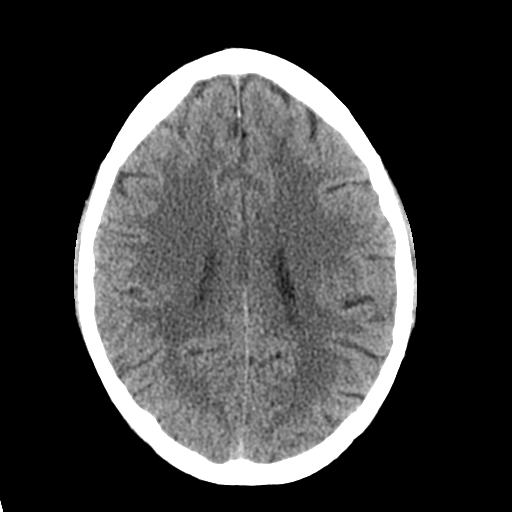
[im 19/29  brain]
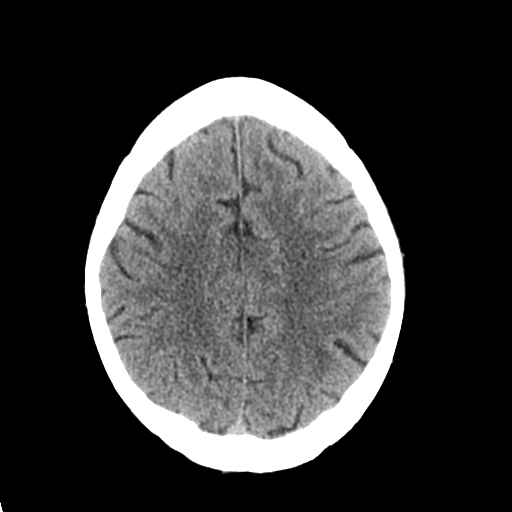
[im 22/29  brain]
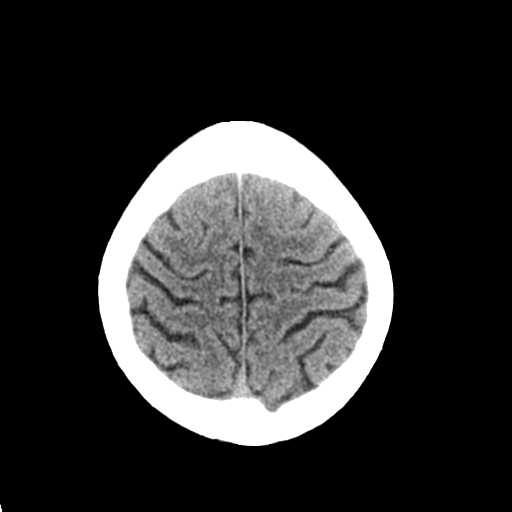
[im 24/29  brain]
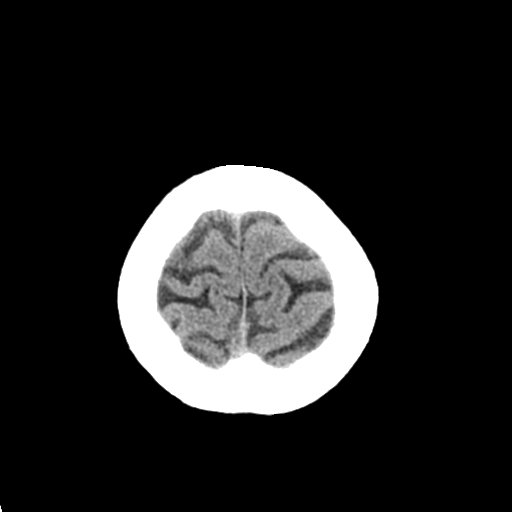
[im 24/29  bone]
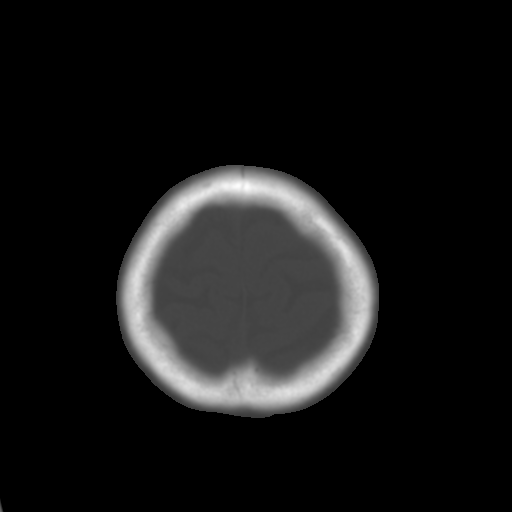
[im 27/29  brain]
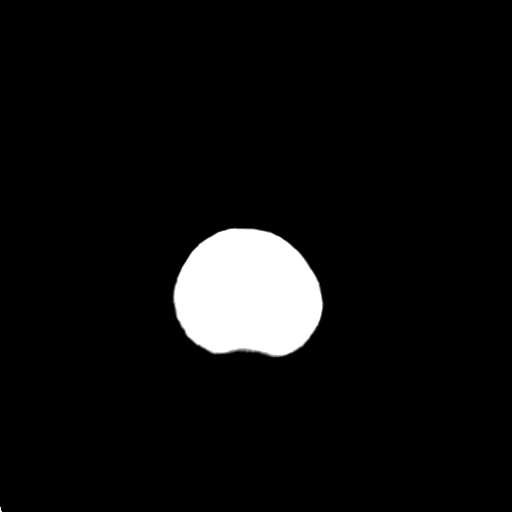

[Series 5: coronal soft tissue · coronal · 0.28mm/px · 3 of 64 slices shown]
[im 22/64  brain]
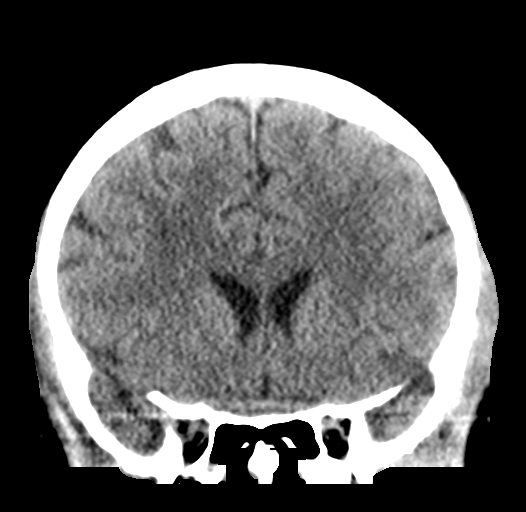
[im 29/64  brain]
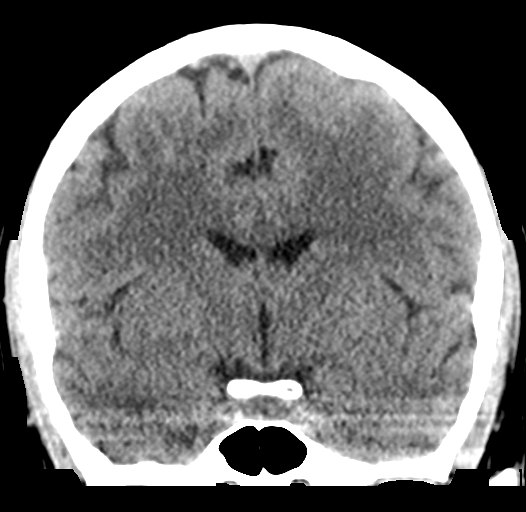
[im 36/64  brain]
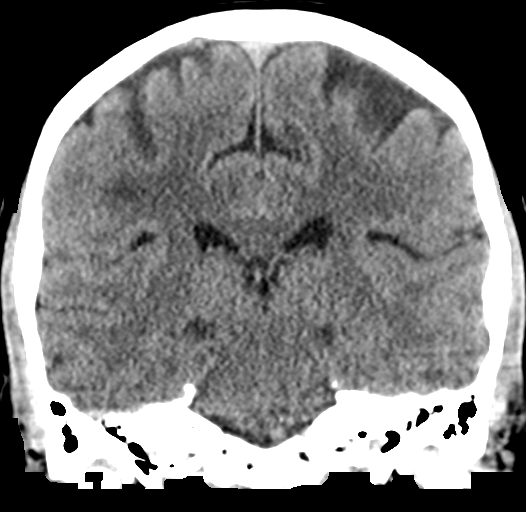

[Series 6: sagittal soft tissue · sagittal · 0.28mm/px · 3 of 50 slices shown]
[im 17/50  brain]
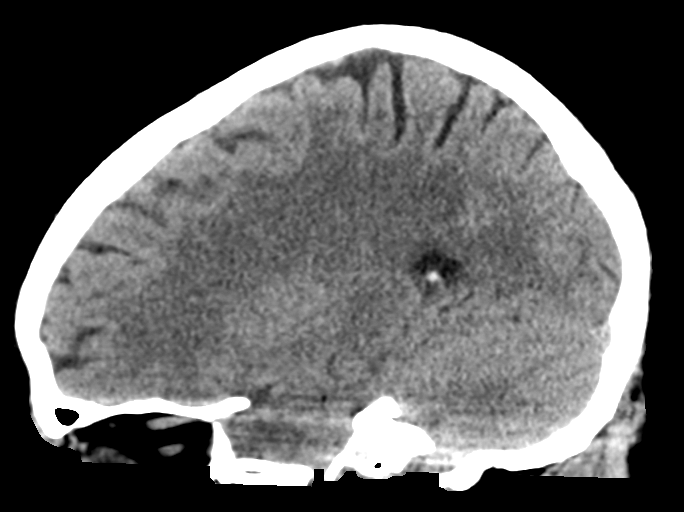
[im 25/50  brain]
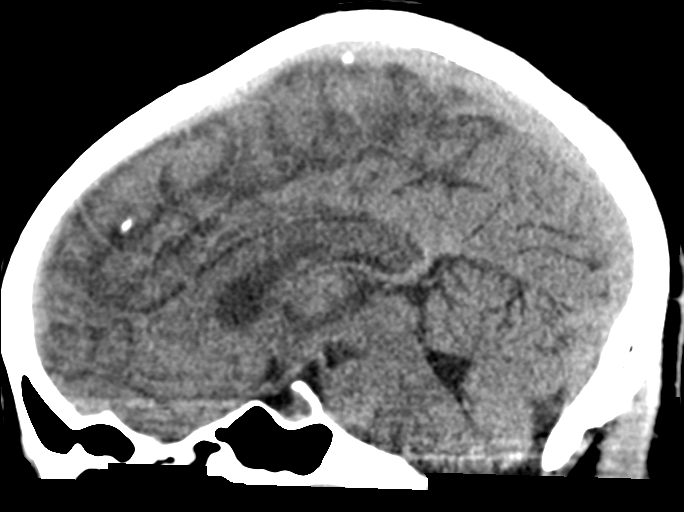
[im 33/50  brain]
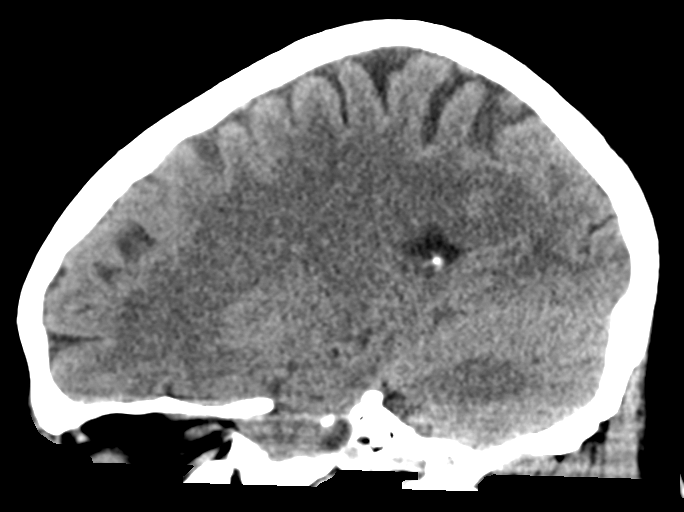

[16 of 46 positions shown; findings below may reference images not displayed]

FINDINGS: Brain: No evidence for acute infarction, hemorrhage, mass lesion,
hydrocephalus, or extra-axial fluid. Normal for age cerebral volume.
Patchy hypoattenuation of the white matter, subcortical location,
likely small vessel disease.

Vascular: Calcification of the cavernous internal carotid arteries
consistent with cerebrovascular atherosclerotic disease. No signs of
intracranial large vessel occlusion.

Skull: Normal. Negative for fracture or focal lesion. Posterior
scalp hematoma.

Sinuses/Orbits: Chronic appearing sinus mucosal thickening.

Other: None.
IMPRESSION: Normal for age cerebral volume with mild small vessel disease. No
acute intracranial findings.
